# Patient Record
Sex: Female | Born: 1935 | Race: White | Hispanic: No
Health system: Southern US, Community
[De-identification: ages and names within clinical notes are randomized; demographics above are authoritative.]

## PROBLEM LIST (undated history)

## (undated) DIAGNOSIS — I4891 Unspecified atrial fibrillation: Secondary | ICD-10-CM

## (undated) DIAGNOSIS — E119 Type 2 diabetes mellitus without complications: Secondary | ICD-10-CM

## (undated) DIAGNOSIS — I10 Essential (primary) hypertension: Secondary | ICD-10-CM

---

## 2017-08-10 ENCOUNTER — Other Ambulatory Visit: Payer: Self-pay

## 2017-08-10 ENCOUNTER — Encounter (HOSPITAL_BASED_OUTPATIENT_CLINIC_OR_DEPARTMENT_OTHER): Payer: Self-pay | Admitting: Emergency Medicine

## 2017-08-10 ENCOUNTER — Emergency Department (HOSPITAL_BASED_OUTPATIENT_CLINIC_OR_DEPARTMENT_OTHER): Payer: Medicare HMO

## 2017-08-10 ENCOUNTER — Emergency Department (HOSPITAL_BASED_OUTPATIENT_CLINIC_OR_DEPARTMENT_OTHER)
Admission: EM | Admit: 2017-08-10 | Discharge: 2017-08-10 | Disposition: A | Discharge status: Other Acute Inpatient Hospital | Payer: Medicare HMO | Attending: Emergency Medicine | Admitting: Emergency Medicine

## 2017-08-10 DIAGNOSIS — E119 Type 2 diabetes mellitus without complications: Secondary | ICD-10-CM | POA: Diagnosis not present

## 2017-08-10 DIAGNOSIS — R0602 Shortness of breath: Secondary | ICD-10-CM | POA: Diagnosis present

## 2017-08-10 DIAGNOSIS — Z7984 Long term (current) use of oral hypoglycemic drugs: Secondary | ICD-10-CM | POA: Diagnosis not present

## 2017-08-10 DIAGNOSIS — I10 Essential (primary) hypertension: Secondary | ICD-10-CM | POA: Diagnosis not present

## 2017-08-10 DIAGNOSIS — J810 Acute pulmonary edema: Secondary | ICD-10-CM | POA: Insufficient documentation

## 2017-08-10 DIAGNOSIS — I4891 Unspecified atrial fibrillation: Secondary | ICD-10-CM | POA: Diagnosis not present

## 2017-08-10 DIAGNOSIS — Z79899 Other long term (current) drug therapy: Secondary | ICD-10-CM | POA: Insufficient documentation

## 2017-08-10 DIAGNOSIS — Z7901 Long term (current) use of anticoagulants: Secondary | ICD-10-CM | POA: Insufficient documentation

## 2017-08-10 HISTORY — DX: Type 2 diabetes mellitus without complications: E11.9

## 2017-08-10 HISTORY — DX: Unspecified atrial fibrillation: I48.91

## 2017-08-10 HISTORY — DX: Essential (primary) hypertension: I10

## 2017-08-10 LAB — COMPREHENSIVE METABOLIC PANEL
ALT: 26 U/L (ref 14–54)
ANION GAP: 12 (ref 5–15)
AST: 37 U/L (ref 15–41)
Albumin: 3.1 g/dL — ABNORMAL LOW (ref 3.5–5.0)
Alkaline Phosphatase: 106 U/L (ref 38–126)
BILIRUBIN TOTAL: 1 mg/dL (ref 0.3–1.2)
BUN: 15 mg/dL (ref 6–20)
CO2: 22 mmol/L (ref 22–32)
Calcium: 9.8 mg/dL (ref 8.9–10.3)
Chloride: 104 mmol/L (ref 101–111)
Creatinine, Ser: 0.82 mg/dL (ref 0.44–1.00)
GFR calc Af Amer: >60 mL/min (ref >60)
Glucose, Bld: 242 mg/dL — ABNORMAL HIGH (ref 65–99)
POTASSIUM: 3.7 mmol/L (ref 3.5–5.1)
Sodium: 138 mmol/L (ref 135–145)
TOTAL PROTEIN: 6.8 g/dL (ref 6.5–8.1)

## 2017-08-10 LAB — CBC
HEMATOCRIT: 35 % — AB (ref 36.0–46.0)
Hemoglobin: 10.9 g/dL — ABNORMAL LOW (ref 12.0–15.0)
MCH: 27.3 pg (ref 26.0–34.0)
MCHC: 31.1 g/dL (ref 30.0–36.0)
MCV: 87.5 fL (ref 78.0–100.0)
Platelets: 257 10*3/uL (ref 150–400)
RBC: 4 MIL/uL (ref 3.87–5.11)
RDW: 16.4 % — AB (ref 11.5–15.5)
WBC: 16.9 10*3/uL — ABNORMAL HIGH (ref 4.0–10.5)

## 2017-08-10 LAB — MAGNESIUM: Magnesium: 1.4 mg/dL — ABNORMAL LOW (ref 1.7–2.4)

## 2017-08-10 LAB — TROPONIN I

## 2017-08-10 LAB — BRAIN NATRIURETIC PEPTIDE: B Natriuretic Peptide: 205 pg/mL — ABNORMAL HIGH (ref 0.0–100.0)

## 2017-08-10 MED ORDER — MAGNESIUM SULFATE 50 % IJ SOLN
2.0000 g | Freq: Once | INTRAMUSCULAR | Status: DC
  Filled 2017-08-10: qty 4

## 2017-08-10 MED ORDER — FUROSEMIDE 10 MG/ML IJ SOLN
40.0000 mg | Freq: Once | INTRAMUSCULAR | Status: AC
  Administered 2017-08-10: 40 mg via INTRAVENOUS
  Filled 2017-08-10: qty 4

## 2017-08-10 MED ORDER — DILTIAZEM HCL 100 MG IV SOLR
5.0000 mg/h | INTRAVENOUS | Status: DC
  Administered 2017-08-10: 5 mg/h via INTRAVENOUS
  Filled 2017-08-10: qty 100

## 2017-08-10 MED ORDER — MAGNESIUM SULFATE 2 GM/50ML IV SOLN
2.0000 g | Freq: Once | INTRAVENOUS | Status: AC
  Administered 2017-08-10: 2 g via INTRAVENOUS
  Filled 2017-08-10: qty 50

## 2017-08-10 MED ORDER — FUROSEMIDE 10 MG/ML IJ SOLN
20.0000 mg | Freq: Once | INTRAMUSCULAR | Status: AC
  Administered 2017-08-10: 20 mg via INTRAVENOUS
  Filled 2017-08-10: qty 2

## 2017-08-10 MED ORDER — DILTIAZEM LOAD VIA INFUSION
10.0000 mg | Freq: Once | INTRAVENOUS | Status: AC
  Administered 2017-08-10: 10 mg via INTRAVENOUS
  Filled 2017-08-10: qty 10

## 2017-08-10 MED ORDER — FUROSEMIDE 10 MG/ML IJ SOLN: 20.0000 mg | Freq: Once | INTRAMUSCULAR | Status: DC

## 2017-08-10 NOTE — ED Triage Notes (Signed)
Pt is notably SOB with difficulty speaking.

## 2017-08-10 NOTE — ED Notes (Signed)
Pt has obvious rattle with inspiration

## 2017-08-10 NOTE — ED Provider Notes (Signed)
[facility] EMERGENCY DEPARTMENT Provider Note   CSN: [id number] Arrival date & time: 08/10/17  1731     History   Chief Complaint Chief Complaint  Patient presents with  . Shortness of Breath    HPI Carrie Christian is a 83 y.o. female.  HPI 83 year old Caucasian female past medical history significant for atrial fibrillation currently anticoagulated, hypertension, diabetes presents to the emergency department today with complaints of shortness of breath and chest tightness.  Patient is unable to give me any history information due to being very dyspneic and tachypneic.  According to patient's daughter and granddaughter patient had acute onset of episode prior to arrival.  States that she got very short of breath and complained of some chest tightness.  Reports a nonproductive cough.  Does report some lower extremity edema.  States that the symptoms started just prior to arrival.  Denies any productive cough, fevers.   Patient was recently discharged from hospital on 12/4 for acute onset atrial fibrillation.  At that time she had normal EF of 60%.  She was started on metoprolol and Cardizem.  Patient takes her medications as prescribed.  At that time she also had uncontrolled diabetes with mild DKA.  These were all corrected.  Patient states she takes her medications as prescribed.  Her granddaughter who is a nurse states that patient did have an episode like this while she was in the hospital.  She required BiPAP and Lasix.  However they did not discharge patient home with Lasix.  Pt denies any fever, chill, ha, vision changes, lightheadedness, dizziness, congestion, neck pain,  abd pain, n/v/d, urinary symptoms, change in bowel habits, melena, hematochezia, lower extremity paresthesias.  Past Medical History:  Diagnosis Date  . Atrial fibrillation (HCC)   . Diabetes mellitus without complication (HCC)   . Hypertension     There are no active problems to display for this  patient.    OB History    No data available       Home Medications    Prior to Admission medications   Medication Sig Start Date End Date Taking? Authorizing Provider  apixaban (ELIQUIS) 5 MG TABS tablet Take 5 mg by mouth 2 (two) times daily.   Yes [provider]  diltiazem (CARDIZEM) 120 MG tablet Take 120 mg by mouth 4 (four) times daily.   Yes [provider]  glimepiride (AMARYL) 4 MG tablet Take 4 mg by mouth daily with breakfast.   Yes [provider]  linagliptin (TRADJENTA) 5 MG TABS tablet Take 5 mg by mouth daily.   Yes [provider]  metFORMIN (GLUCOPHAGE) 500 MG tablet Take by mouth 2 (two) times daily with a meal.   Yes [provider]  metoprolol tartrate (LOPRESSOR) 100 MG tablet Take 100 mg by mouth 2 (two) times daily.   Yes [provider]    Family History No family history on file.  Social History Social History   Tobacco Use  . Smoking status: Not on file  Substance Use Topics  . Alcohol use: Not on file  . Drug use: Not on file     Allergies   Patient has no known allergies.   Review of Systems Review of Systems  Constitutional: Negative for chills and fever.  HENT: Negative for congestion.   Eyes: Negative for visual disturbance.  Respiratory: Positive for cough, chest tightness, shortness of breath and wheezing.   Cardiovascular: Positive for leg swelling. Negative for chest pain and palpitations.  Gastrointestinal: Negative for abdominal pain, diarrhea, nausea and vomiting.  Genitourinary: Negative for dysuria, flank pain, frequency, hematuria, urgency, vaginal bleeding and vaginal discharge.  Musculoskeletal: Negative for arthralgias and myalgias.  Skin: Negative for rash.  Neurological: Negative for dizziness, syncope, weakness, light-headedness, numbness and headaches.  Psychiatric/Behavioral: Negative for sleep disturbance. The patient is not nervous/anxious.      Physical  Exam Updated Vital Signs BP 124/79   Pulse (!) 113   Temp 97.8 F (36.6 C) (Oral)   Resp (!) 22   SpO2 94%   Physical Exam  Constitutional: She is oriented to person, place, and time. She appears well-developed and well-nourished.  Non-toxic appearance. She appears distressed (sob).  HENT:  Head: Normocephalic and atraumatic.  Nose: Nose normal.  Mouth/Throat: Oropharynx is clear and moist.  Eyes: Conjunctivae are normal. Pupils are equal, round, and reactive to light. Right eye exhibits no discharge. Left eye exhibits no discharge.  Neck: Normal range of motion. Neck supple.  Cardiovascular: Normal heart sounds and intact distal pulses. An irregularly irregular rhythm present. Exam reveals no gallop.  No murmur heard. Irregularly irregular rhythm and tachycardia noted.  Pulmonary/Chest: Accessory muscle usage present. No stridor. Tachypnea noted. She is in respiratory distress. She has no decreased breath sounds. She has wheezes. She has rhonchi. She has rales. She exhibits no tenderness.  Patient with increased work of breathing with retractions noted.  Patient satting at 93% on room air.  Abdominal: Soft. Bowel sounds are normal. There is no tenderness. There is no rebound and no guarding.  Musculoskeletal: Normal range of motion. She exhibits edema. She exhibits no tenderness.  2+ pitting edema up to the level of the knees.  Lymphadenopathy:    She has no cervical adenopathy.  Neurological: She is alert and oriented to person, place, and time.  Skin: Skin is warm and dry. Capillary refill takes less than 2 seconds.  Psychiatric: Her behavior is normal. Judgment and thought content normal.  Nursing note and vitals reviewed.    ED Treatments / Results  Labs (all labs ordered are listed, but only abnormal results are displayed) Labs Reviewed  CBC - Abnormal; Notable for the following components:      Result Value   WBC 16.9 (*)    Hemoglobin 10.9 (*)    HCT 35.0 (*)    RDW  16.4 (*)    All other components within normal limits  COMPREHENSIVE METABOLIC PANEL - Abnormal; Notable for the following components:   Glucose, Bld 242 (*)    Albumin 3.1 (*)    All other components within normal limits  BRAIN NATRIURETIC PEPTIDE - Abnormal; Notable for the following components:   B Natriuretic Peptide 205.0 (*)    All other components within normal limits  MAGNESIUM - Abnormal; Notable for the following components:   Magnesium 1.4 (*)    All other components within normal limits  TROPONIN I    EKG  EKG Interpretation  Date/Time:  Saturday August 10 2017 17:38:17 EST Ventricular Rate:  145 PR Interval:    QRS Duration: 74 QT Interval:  292 QTC Calculation: 452 R Axis:   70 Text Interpretation:  A-fib with RVR Low voltage, extremity and precordial leads Consider anterior infarct Repolarization abnormality, prob rate related No STEMI  Confirmed by Alona Bene [phone]) on 08/10/2017 5:54:36 PM       Radiology Dg Chest Portable 1 View  Result Date: 08/10/2017 CLINICAL DATA:  83 year old female with shortness of breath, cough EXAM: PORTABLE CHEST  1 VIEW COMPARISON:  None. FINDINGS: Cardiomegaly. Mediastinal contours are within normal limits. Atherosclerotic calcifications present in the transverse aorta. Diffuse bronchitic changes and mild interstitial prominence. Suspect small left-sided pleural effusion. No pneumothorax. No acute osseous abnormality. Degenerative changes in both acromioclavicular joints. IMPRESSION: 1. Cardiomegaly and diffuse interstitial prominence suggests mild CHF. In the appropriate clinical setting, an atypical or viral respiratory infection could appear similar. 2.  Aortic Atherosclerosis (ICD10-170.0) Electronically Signed   By: Malachy MoanHeath  McCullough M.D.   On: 08/10/2017 17:49    Procedures .Critical Care Performed by: Rise MuLeaphart, Kenneth T, PA-C Authorized by: Rise MuLeaphart, Kenneth T, PA-C   Critical care provider statement:    Critical  care time (minutes):  45   Critical care was necessary to treat or prevent imminent or life-threatening deterioration of the following conditions:  Respiratory failure (Requiring BiPAP)   Critical care was time spent personally by me on the following activities:  Discussions with consultants, discussions with primary provider, evaluation of patient's response to treatment, development of treatment plan with patient or surrogate, examination of patient, obtaining history from patient or surrogate, ordering and performing treatments and interventions, ordering and review of laboratory studies, ordering and review of radiographic studies, pulse oximetry and review of old charts   (including critical care time)  Medications Ordered in ED Medications  diltiazem (CARDIZEM) 1 mg/mL load via infusion 10 mg (10 mg Intravenous Bolus from Bag 08/10/17 1801)    And  diltiazem (CARDIZEM) 100 mg in dextrose 5 % 100 mL (1 mg/mL) infusion (15 mg/hr Intravenous Rate/Dose Change 08/10/17 1919)  magnesium sulfate IVPB 2 g 50 mL (2 g Intravenous New Bag/Given 08/10/17 1846)  furosemide (LASIX) injection 40 mg (40 mg Intravenous Given 08/10/17 1752)  furosemide (LASIX) injection 20 mg (20 mg Intravenous Given 08/10/17 1919)     Initial Impression / Assessment and Plan / ED Course  I have reviewed the triage vital signs and the nursing notes.  Pertinent labs & imaging results that were available during my care of the patient were reviewed by me and considered in my medical decision making (see chart for details).     83 year old female past medical history significant for atrial fibrillation currently anticoagulated, diabetes, hypertension presents to the ED with acute onset of shortness of breath and chest tightness.  Patient also has bilateral lower extremity edema and audible crackles with nonproductive cough.  Patient denies any associated fevers or chest pain.  On initial examination patient is afebrile.   She is tachycardic with a regular rhythm, hypoxic and tachypnea noted.  Patient has increased work of breathing and mild respiratory distress.  Lungs with audible crackles noted.  Pitting edema to lower extremities.  Abdominal exam is benign.  Given patient's increased work of breathing with audible crackles concern for possible flash pulmonary edema.  Patient was started on BiPAP.  She was given IV Lasix.  KG consistent with A. fib with RVR.  She was started on a diltiazem drip.  Heart rate has improved.  The portal chest x-ray shows pulmonary edema versus atypical infection.  Patient denies any cough or fevers.  Patient symptoms were acute in onset prior to arrival.  Low suspicion for pneumonia or infectious cause at this time.  Seems more consistent with pulmonary edema.  Patient was recently admitted for acute A. fib with RVR.  Daughter states that she had an episode like this in the hospital where she had to take Lasix.  Patient was not discharged home with Lasix.  While  in the hospital patient had A. fib with RVR.  However patient's rate on discharge was controlled with Cardizem and metoprolol.  Patient states takes medications as prescribed.  Lab reveals a leukocytosis of 16,000.  Magnesium was 1.4.  Normal potassium.  Glucose is mildly elevated.  Hemoglobin appears at patient's baseline.  Mildly elevated BNP of 205.  Troponin was negative.  KG shows no ischemic changes.  Low suspicion for ACS at this time.  She was rechecked and has improved with BiPAP and Lasix.  Normal urine output.  The patient feels slightly improved at this time.  Heart rate has improved with the Cardizem.  Family and patient would like to be [facility].  Dr. Jacqulyn BathLong spoke with attending Dr[facility] Marko [facility]KNC [facility] with hospital medicine who agrees to admission.  [facility] EMS [facility] at this time. EMTALA was placed by attending.  Only updated on plan of care.   Patient remains hemodynamically stable at this time.    Final Clinical Impressions(s) / ED Diagnoses   Final diagnoses:  Atrial fibrillation with RVR (HCC)  SOB (shortness of breath)  Acute pulmonary edema [facility](HCC)    ED Discharge Orders    None       Wallace KellerLeaphart, Kenneth T, PA-C 08/10/17 1954    Maia PlanLong, Joshua G, MD 08/11/17 1236

## 2017-08-10 NOTE — ED Notes (Signed)
ED Provider at bedside to re-evaluate pt for transport.

## 2018-12-25 IMAGING — DX DG CHEST 1V PORT
1 series · 1 of 1 positions shown · non-contrast
Comparison: None.

CLINICAL DATA: 85-year-old female with shortness of breath, cough

EXAM:
PORTABLE CHEST 1 VIEW

[chest ap]
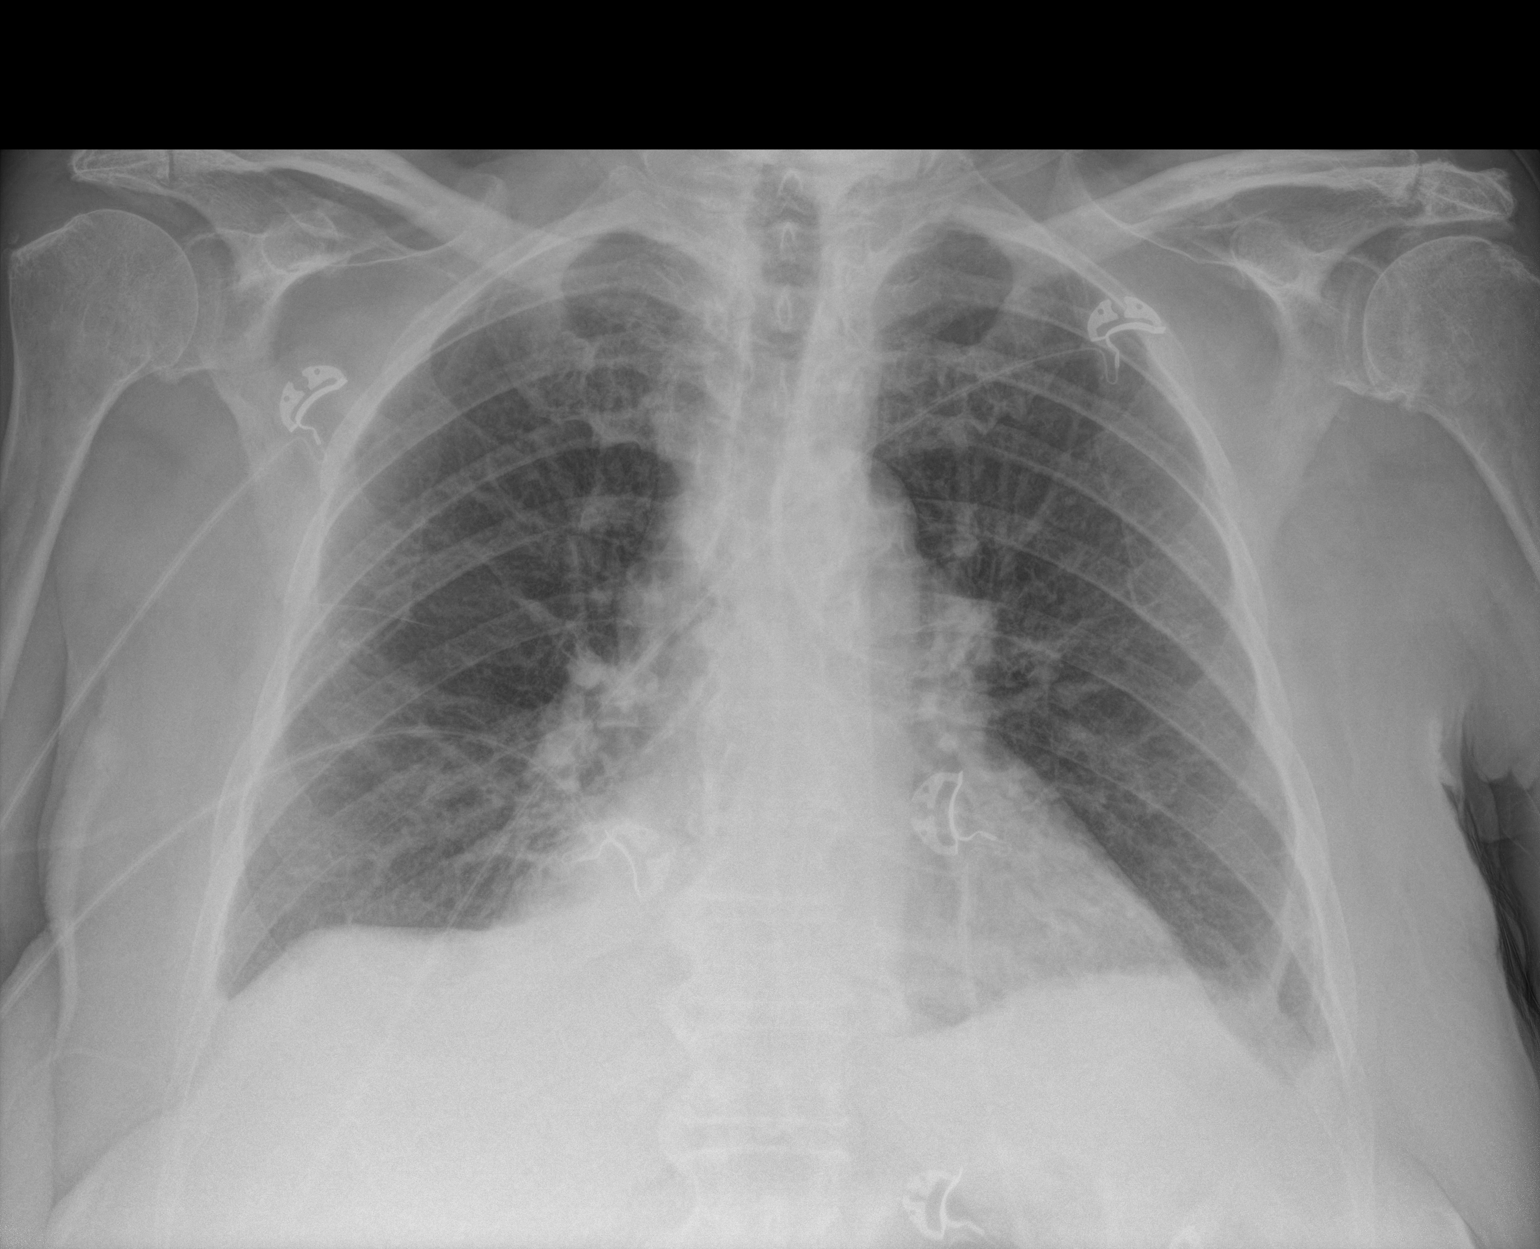

[1 of 1 positions shown; findings below may reference images not displayed]

FINDINGS: Cardiomegaly. Mediastinal contours are within normal limits.
Atherosclerotic calcifications present in the transverse aorta.
Diffuse bronchitic changes and mild interstitial prominence. Suspect
small left-sided pleural effusion. No pneumothorax. No acute osseous
abnormality. Degenerative changes in both acromioclavicular joints.
IMPRESSION: 1. Cardiomegaly and diffuse interstitial prominence suggests mild
CHF. In the appropriate clinical setting, an atypical or viral
respiratory infection could appear similar.
2.  Aortic Atherosclerosis ([id number]-170.0)

## 2023-10-19 DEATH — deceased
